# Patient Record
Sex: Female | Born: 1960
Health system: Southern US, Community
[De-identification: ages and names within clinical notes are randomized; demographics above are authoritative.]

## PROBLEM LIST (undated history)

## (undated) DIAGNOSIS — F419 Anxiety disorder, unspecified: Secondary | ICD-10-CM

## (undated) DIAGNOSIS — K219 Gastro-esophageal reflux disease without esophagitis: Secondary | ICD-10-CM

## (undated) DIAGNOSIS — F329 Major depressive disorder, single episode, unspecified: Secondary | ICD-10-CM

## (undated) DIAGNOSIS — G43909 Migraine, unspecified, not intractable, without status migrainosus: Secondary | ICD-10-CM

## (undated) DIAGNOSIS — F32A Depression, unspecified: Secondary | ICD-10-CM

## (undated) HISTORY — DX: Migraine, unspecified, not intractable, without status migrainosus: G43.909

## (undated) HISTORY — PX: BACK SURGERY: SHX140

## (undated) HISTORY — PX: KNEE ARTHROSCOPY: SHX127

## (undated) HISTORY — DX: Depression, unspecified: F32.A

## (undated) HISTORY — DX: Anxiety disorder, unspecified: F41.9

## (undated) HISTORY — DX: Major depressive disorder, single episode, unspecified: F32.9

## (undated) HISTORY — DX: Gastro-esophageal reflux disease without esophagitis: K21.9

---

## 2000-03-09 ENCOUNTER — Ambulatory Visit (HOSPITAL_COMMUNITY): Admission: RE | Admit: 2000-03-09 | Discharge: 2000-03-09 | Payer: Self-pay | Admitting: Psychiatry

## 2000-06-09 ENCOUNTER — Encounter: Payer: Self-pay | Admitting: Neurology

## 2000-06-09 ENCOUNTER — Ambulatory Visit (HOSPITAL_COMMUNITY): Admission: RE | Admit: 2000-06-09 | Discharge: 2000-06-09 | Payer: Self-pay | Admitting: Neurology

## 2005-09-29 ENCOUNTER — Ambulatory Visit: Payer: Self-pay | Admitting: Obstetrics and Gynecology

## 2005-10-26 HISTORY — PX: ABDOMINAL HYSTERECTOMY: SHX81

## 2006-06-21 ENCOUNTER — Ambulatory Visit: Payer: Self-pay | Admitting: Obstetrics and Gynecology

## 2007-11-17 ENCOUNTER — Ambulatory Visit: Payer: Self-pay

## 2007-12-14 ENCOUNTER — Ambulatory Visit: Payer: Self-pay | Admitting: Chiropractic Medicine

## 2008-01-20 ENCOUNTER — Encounter (INDEPENDENT_AMBULATORY_CARE_PROVIDER_SITE_OTHER): Payer: Self-pay | Admitting: Orthopedic Surgery

## 2008-01-20 ENCOUNTER — Ambulatory Visit (HOSPITAL_COMMUNITY): Admission: RE | Admit: 2008-01-20 | Discharge: 2008-01-21 | Payer: Self-pay | Admitting: Orthopedic Surgery

## 2008-03-06 ENCOUNTER — Encounter: Payer: Self-pay | Admitting: Orthopedic Surgery

## 2008-03-26 ENCOUNTER — Encounter: Payer: Self-pay | Admitting: Orthopedic Surgery

## 2008-04-25 ENCOUNTER — Encounter: Payer: Self-pay | Admitting: Orthopedic Surgery

## 2008-11-20 ENCOUNTER — Ambulatory Visit: Payer: Self-pay

## 2009-04-01 ENCOUNTER — Ambulatory Visit: Payer: Self-pay | Admitting: Internal Medicine

## 2009-11-21 ENCOUNTER — Ambulatory Visit: Payer: Self-pay

## 2010-06-04 ENCOUNTER — Ambulatory Visit: Payer: Self-pay | Admitting: Internal Medicine

## 2010-12-01 ENCOUNTER — Ambulatory Visit: Payer: Self-pay

## 2011-03-10 NOTE — Op Note (Signed)
Alexandra Mendez, Alexandra Mendez           ACCOUNT NO.:  [id number]   MEDICAL RECORD NO.:  [id number]          PATIENT TYPE:  AMB   LOCATION:  DAY                          FACILITY:  [facility]   PHYSICIAN:  Alvy Beal, MD    DATE OF BIRTH:  May 25, 1961   DATE OF PROCEDURE:  01/20/2008  DATE OF DISCHARGE:                               OPERATIVE REPORT   PREOPERATIVE DIAGNOSIS:  Severe left leg pain with L4-5 synovial cyst  left side.   POSTOPERATIVE DIAGNOSIS:  Severe left leg pain with L4-5 synovial cyst  left side.   OPERATIVE PROCEDURE:  L4-5 left-sided decompression and cyst excision  (foraminotomy).   SURGEON:  Alvy Beal, MD.   FIRST ASSISTANT:  Crissie Reese, PA   INDICATED HISTORY:  This is a very pleasant 50 year old woman who  presented to my office acutely with severe left leg pain and severe back  pain.  MRI and clinical exam confirmed the diagnosis of radiculopathy L5  secondary to a left-sided synovial cyst.  After discussing treatment  options the patient elected to proceed with surgery because of the  severity of the radicular leg pain.  All appropriate risks, benefits and  alternatives were discussed with the patient and consent was obtained.   PROCEDURE IN DETAIL:  The patient was brought into the operating room,  placed supine on the operating table.  After successful induction of  general anesthesia endotracheal intubation, TEDs/SCDs were applied.  She  was turned prone onto a Wilson frame and all bony prominences were well-  padded.  The arms were placed overhead.  The back was prepped and  draped.  Two needles were placed into the lumbar spine.  Intraoperative  x-rays were taken.  At this point because of a transitional vertebra I  noted that the needle placement was at the appropriate level.  I then  sharply made an incision and sharply dissected the paraspinal muscles  off the L4-L5 spinous processes to expose the interbody space.  I then  placed a  Penfield 4 underneath the L4 lamina and took an x-ray.  I  confirmed that I was at the appropriate level and then using a 3 mm  Kerrison performed a laminotomy.  I then excised the ligamentum flavum  by developing a plane between it and the dura and identified a very  large facet left-sided cyst.  I then resected this cyst in its entirety  and sent it to pathology.  I then dissected down the lateral recess and  palpated along the L4-5 facet to ensure that I had debrided the entire  cyst.  Once I had removed the entire cyst I obtained hemostasis using  bipolar electrocautery.  At this point I could freely pass a hockey  stick (dural elevator) superiorly along the lateral recess, inferiorly  and out the neural foramen.  I could even sweep medially.  I could  visualize the medial border of the pedicle and therefore I knew I had an  adequate lateral recess decompression and I excised the entire cyst.  At  this point clinically I could not see any more of the  cyst either.  I  irrigated the wound copiously with normal saline, used bipolar  electrocautery to obtain hemostasis and then maintained it with FloSeal.  I then closed the deep  fascia with interrupted #1 Vicryl sutures, superficial with 2-0 Vicryl  sutures and then 3-0 Monocryl for the skin.  Steri-Strips, dry dressing  were applied and the patient was extubated and transferred to the PACU  without incident.  At the end of the case all needle and sponge counts  were correct.      Alvy Beal, MD  Electronically Signed     DDB/MEDQ  D:  01/20/2008  T:  01/21/2008  Job:  [id number]

## 2011-03-13 NOTE — Procedures (Signed)
[place]  Patient:    KISHA, MESSMAN                    MRN: [id number] Proc. Date: 03/09/00 Adm. Date:  [id number] Attending:  Auburn Bilberry Courson                           Procedure Report  DATE OF BIRTH:  1961-06-26  INDICATIONS:  Patient with symptoms of dizziness and numbness, and white matter changes noted on MRI.  A lumber puncture to further evaluated for possible multiple sclerosis.  DESCRIPTION OF PROCEDURE:  The patient was prepped and draped in a sterile fashion.  Anesthesia was achieved with 1% lidocaine in the L4-5 interspace. Following this, a #20 gauge spinal needle was inserted into the same interspace until spinal fluid was obtained.  Main pressure was 130 cc water. Approximately 10 cc of clear spinal fluid was removed and sent for studies. Tube #1 was protein and glucose.  Tube #2 was cell count differential. Tube #3 ______ and IgG index.  Tube #4 was to held in the lab.  The patient tolerated the procedure without difficulty. DD:  03/09/00 TD:  03/10/00 Job: [id number]

## 2011-07-20 LAB — HEMOGLOBIN AND HEMATOCRIT, BLOOD
HCT: 38.4
Hemoglobin: 13

## 2011-08-20 ENCOUNTER — Ambulatory Visit: Payer: Self-pay | Admitting: Obstetrics and Gynecology

## 2011-12-31 ENCOUNTER — Ambulatory Visit: Payer: Self-pay

## 2013-01-02 ENCOUNTER — Ambulatory Visit: Payer: Self-pay

## 2013-01-18 ENCOUNTER — Emergency Department: Payer: Self-pay | Admitting: Emergency Medicine

## 2013-01-18 LAB — CK TOTAL AND CKMB (NOT AT ARMC)
CK, Total: 69 U/L (ref 21–215)
CK-MB: 1.3 ng/mL (ref 0.5–3.6)

## 2013-01-18 LAB — COMPREHENSIVE METABOLIC PANEL
Albumin: 4 g/dL (ref 3.4–5.0)
Alkaline Phosphatase: 77 U/L (ref 50–136)
Anion Gap: 5 — ABNORMAL LOW (ref 7–16)
BUN: 17 mg/dL (ref 7–18)
Bilirubin,Total: 0.4 mg/dL (ref 0.2–1.0)
Calcium, Total: 9 mg/dL (ref 8.5–10.1)
Chloride: 104 mmol/L (ref 98–107)
Co2: 29 mmol/L (ref 21–32)
Creatinine: 0.91 mg/dL (ref 0.60–1.30)
EGFR (African American): >60
EGFR (Non-African Amer.): >60
Glucose: 92 mg/dL (ref 65–99)
Osmolality: 277 (ref 275–301)
Potassium: 3.7 mmol/L (ref 3.5–5.1)
SGOT(AST): 18 U/L (ref 15–37)
SGPT (ALT): 22 U/L (ref 12–78)
Sodium: 138 mmol/L (ref 136–145)
Total Protein: 7.2 g/dL (ref 6.4–8.2)

## 2013-01-18 LAB — TROPONIN I: Troponin-I: <0.02 ng/mL

## 2013-01-18 LAB — CBC
HCT: 39.5 % (ref 35.0–47.0)
HGB: 13.5 g/dL (ref 12.0–16.0)
MCH: 29.2 pg (ref 26.0–34.0)
MCHC: 34.2 g/dL (ref 32.0–36.0)
MCV: 85 fL (ref 80–100)
Platelet: 210 10*3/uL (ref 150–440)
RBC: 4.63 10*6/uL (ref 3.80–5.20)
RDW: 13.7 % (ref 11.5–14.5)
WBC: 5 10*3/uL (ref 3.6–11.0)

## 2014-01-03 ENCOUNTER — Ambulatory Visit: Payer: Self-pay

## 2014-04-02 DIAGNOSIS — F32A Depression, unspecified: Secondary | ICD-10-CM | POA: Insufficient documentation

## 2014-04-02 DIAGNOSIS — K219 Gastro-esophageal reflux disease without esophagitis: Secondary | ICD-10-CM | POA: Insufficient documentation

## 2014-04-02 DIAGNOSIS — F329 Major depressive disorder, single episode, unspecified: Secondary | ICD-10-CM | POA: Insufficient documentation

## 2014-04-02 DIAGNOSIS — G43909 Migraine, unspecified, not intractable, without status migrainosus: Secondary | ICD-10-CM | POA: Insufficient documentation

## 2014-04-02 DIAGNOSIS — R030 Elevated blood-pressure reading, without diagnosis of hypertension: Secondary | ICD-10-CM | POA: Insufficient documentation

## 2015-01-08 ENCOUNTER — Ambulatory Visit: Payer: Self-pay

## 2015-05-10 ENCOUNTER — Encounter: Payer: Self-pay | Admitting: Obstetrics and Gynecology

## 2015-05-15 ENCOUNTER — Encounter: Payer: Self-pay | Admitting: Obstetrics and Gynecology

## 2015-05-15 ENCOUNTER — Ambulatory Visit (INDEPENDENT_AMBULATORY_CARE_PROVIDER_SITE_OTHER): Payer: No Typology Code available for payment source | Admitting: Obstetrics and Gynecology

## 2015-05-15 VITALS — BP 133/90 | HR 81 | Ht 62.0 in | Wt 158.6 lb

## 2015-05-15 DIAGNOSIS — E663 Overweight: Secondary | ICD-10-CM | POA: Diagnosis not present

## 2015-05-15 DIAGNOSIS — F411 Generalized anxiety disorder: Secondary | ICD-10-CM | POA: Diagnosis not present

## 2015-05-15 DIAGNOSIS — N951 Menopausal and female climacteric states: Secondary | ICD-10-CM | POA: Diagnosis not present

## 2015-05-15 DIAGNOSIS — N941 Dyspareunia: Secondary | ICD-10-CM | POA: Diagnosis not present

## 2015-05-15 DIAGNOSIS — IMO0002 Reserved for concepts with insufficient information to code with codable children: Secondary | ICD-10-CM

## 2015-05-15 MED ORDER — PHENTERMINE HCL 37.5 MG PO TABS: 37.5000 mg | ORAL_TABLET | Freq: Every day | ORAL | Status: DC

## 2015-05-15 MED ORDER — CYANOCOBALAMIN 1000 MCG/ML IJ SOLN
1000.0000 ug | Freq: Once | INTRAMUSCULAR | Status: AC
  Administered 2015-05-15: 1000 ug via INTRAMUSCULAR

## 2015-05-15 MED ORDER — LORAZEPAM 0.5 MG PO TABS: 0.5000 mg | ORAL_TABLET | Freq: Three times a day (TID) | ORAL | Status: DC

## 2015-05-15 MED ORDER — CYANOCOBALAMIN 1000 MCG/ML IJ SOLN: 1000.0000 ug | Freq: Once | INTRAMUSCULAR | Status: DC

## 2015-05-15 NOTE — Patient Instructions (Signed)
Thank you for enrolling in MyChart. Please follow the instructions below to securely access your online medical record. MyChart allows you to send messages to your doctor, view your test results, renew your prescriptions, schedule appointments, and more.  How Do I Sign Up? 1. In your Internet browser, go to [email or web address]. 2. [email or web address] on the New  User? link in the Sign In box.  3. Enter your MyChart Access Code exactly as it appears below. You will not need to use this code after you have completed the sign-up process. If you do not sign up before the expiration date, you must request a new code. MyChart Access Code: [email or web address] Expires: 07/14/2015  3:44 PM  4. Enter the last four digits of your Social Security Number (xxxx) and Date of Birth (mm/dd/yyyy) as indicated and click Next. You will be taken to the next sign-up page. 5. Create a MyChart ID. This will be your MyChart login ID and cannot be changed, so think of one that is secure and easy to remember. 6. Create a MyChart password. You can change your password at any time. 7. Enter your Password Reset Question and Answer and click Next. This can be used at a later time if you forget your password.  8. Select your communication preference, and if applicable enter your e-mail address. You will receive e-mail notification when new information is available in MyChart by choosing to receive e-mail notifications and filling in your e-mail. 9. Click Sign In. You can now view your medical record.   Additional Information If you have questions, you can email [email or web address] or call [phone] to talk to our MyChart staff. Remember, MyChart is NOT to be used for urgent needs. For medical emergencies, dial 911.   Calorie Counting for Weight Loss Calories are energy you get from the things you eat and drink. Your body uses this energy to keep you going throughout the day. The number of calories you eat  affects your weight. When you eat more calories than your body needs, your body stores the extra calories as fat. When you eat fewer calories than your body needs, your body burns fat to get the energy it needs. Calorie counting means keeping track of how many calories you eat and drink each day. If you make sure to eat fewer calories than your body needs, you should lose weight. In order for calorie counting to work, you will need to eat the number of calories that are right for you in a day to lose a healthy amount of weight per week. A healthy amount of weight to lose per week is usually 1-2 lb (0.5-0.9 kg). A dietitian can determine how many calories you need in a day and give you suggestions on how to reach your calorie goal.  WHAT IS MY MY PLAN? My goal is to have __________ calories per day.  If I have this many calories per day, I should lose around __________ pounds per week. WHAT DO I NEED TO KNOW ABOUT CALORIE COUNTING? In order to meet your daily calorie goal, you will need to:  Find out how many calories are in each food you would like to eat. Try to do this before you eat.  Decide how much of the food you can eat.  Write down what you ate and how many calories it had. Doing this is called keeping a food log. WHERE DO I FIND CALORIE INFORMATION? The number of calories in a food can  be found on a Nutrition Facts label. Note that all the information on a label is based on a specific serving of the food. If a food does not have a Nutrition Facts label, try to look up the calories online or ask your dietitian for help. HOW DO I DECIDE HOW MUCH TO EAT? To decide how much of the food you can eat, you will need to consider both the number of calories in one serving and the size of one serving. This information can be found on the Nutrition Facts label. If a food does not have a Nutrition Facts label, look up the information online or ask your dietitian for help. Remember that calories are  listed per serving. If you choose to have more than one serving of a food, you will have to multiply the calories per serving by the amount of servings you plan to eat. For example, the label on a package of bread might say that a serving size is 1 slice and that there are 90 calories in a serving. If you eat 1 slice, you will have eaten 90 calories. If you eat 2 slices, you will have eaten 180 calories. HOW DO I KEEP A FOOD LOG? After each meal, record the following information in your food log:  What you ate.  How much of it you ate.  How many calories it had.  Then, add up your calories. Keep your food log near you, such as in a small notebook in your pocket. Another option is to use a mobile app or website. Some programs will calculate calories for you and show you how many calories you have left each time you add an item to the log. WHAT ARE SOME CALORIE COUNTING TIPS?  Use your calories on foods and drinks that will fill you up and not leave you hungry. Some examples of this include foods like nuts and nut butters, vegetables, lean proteins, and high-fiber foods (more than 5 g fiber per serving).  Eat nutritious foods and avoid empty calories. Empty calories are calories you get from foods or beverages that do not have many nutrients, such as candy and soda. It is better to have a nutritious high-calorie food (such as an avocado) than a food with few nutrients (such as a bag of chips).  Know how many calories are in the foods you eat most often. This way, you do not have to look up how many calories they have each time you eat them.  Look out for foods that may seem like low-calorie foods but are really high-calorie foods, such as baked goods, soda, and fat-free candy.  Pay attention to calories in drinks. Drinks such as sodas, specialty coffee drinks, alcohol, and juices have a lot of calories yet do not fill you up. Choose low-calorie drinks like water and diet drinks.  Focus your  calorie counting efforts on higher calorie items. Logging the calories in a garden salad that contains only vegetables is less important than calculating the calories in a milk shake.  Find a way of tracking calories that works for you. Get creative. Most people who are successful find ways to keep track of how much they eat in a day, even if they do not count every calorie. WHAT ARE SOME PORTION CONTROL TIPS?  Know how many calories are in a serving. This will help you know how many servings of a certain food you can have.  Use a measuring cup to measure serving sizes. This is  helpful when you start out. With time, you will be able to estimate serving sizes for some foods.  Take some time to put servings of different foods on your favorite plates, bowls, and cups so you know what a serving looks like.  Try not to eat straight from a bag or box. Doing this can lead to overeating. Put the amount you would like to eat in a cup or on a plate to make sure you are eating the right portion.  Use smaller plates, glasses, and bowls to prevent overeating. This is a quick and easy way to practice portion control. If your plate is smaller, less food can fit on it.  Try not to multitask while eating, such as watching TV or using your computer. If it is time to eat, sit down at a table and enjoy your food. Doing this will help you to start recognizing when you are full. It will also make you more aware of what and how much you are eating. HOW CAN I CALORIE COUNT WHEN EATING OUT?  Ask for smaller portion sizes or child-sized portions.  Consider sharing an entree and sides instead of getting your own entree.  If you get your own entree, eat only half. Ask for a box at the beginning of your meal and put the rest of your entree in it so you are not tempted to eat it.  Look for the calories on the menu. If calories are listed, choose the lower calorie options.  Choose dishes that include vegetables, fruits,  whole grains, low-fat dairy products, and lean protein. Focusing on smart food choices from each of the 5 food groups can help you stay on track at restaurants.  Choose items that are boiled, broiled, grilled, or steamed.  Choose water, milk, unsweetened iced tea, or other drinks without added sugars. If you want an alcoholic beverage, choose a lower calorie option. For example, a regular margarita can have up to 700 calories and a glass of wine has around 150.  Stay away from items that are buttered, battered, fried, or served with cream sauce. Items labeled "crispy" are usually fried, unless stated otherwise.  Ask for dressings, sauces, and syrups on the side. These are usually very high in calories, so do not eat much of them.  Watch out for salads. Many people think salads are a healthy option, but this is often not the case. Many salads come with bacon, fried chicken, lots of cheese, fried chips, and dressing. All of these items have a lot of calories. If you want a salad, choose a garden salad and ask for grilled meats or steak. Ask for the dressing on the side, or ask for olive oil and vinegar or lemon to use as dressing.  Estimate how many servings of a food you are given. For example, a serving of cooked rice is  cup or about the size of half a tennis ball or one cupcake wrapper. Knowing serving sizes will help you be aware of how much food you are eating at restaurants. The list below tells you how big or small some common portion sizes are based on everyday objects.  1 oz--4 stacked dice.  3 oz--1 deck of cards.  1 tsp--1 dice.  1 Tbsp-- a Ping-Pong ball.  2 Tbsp--1 Ping-Pong ball.   cup--1 tennis ball or 1 cupcake wrapper.  1 cup--1 baseball. Document Released: 10/12/2005 Document Revised: 02/26/2014 Document Reviewed: 08/17/2013 [facility] Patient Information 2015 [place], [place]. This information is not  intended to replace advice given to you by your health care provider.  Make sure you discuss any questions you have with your health care provider.

## 2015-05-15 NOTE — Progress Notes (Signed)
Subjective:     Patient ID: Alexandra Mendez, female   DOB: 1961-02-14, 54 y.o.   MRN: [id number]  HPI Previously saw Flo Shanks CNM at [facility] before she retired- last AE in January and labs were normal.    Here to establish care and discuss a couple of health issues/concerns Review of Systems Increased hot flashes x 1 year (s/p partial hysterectomy 2007 for fibroids), with occasional night sweats, also has noticed an increase in anxiety and hot flashes during panic episodes. Reports progressively worsening pain during intercourse with 2 episodes of bleeding afterwards; partner not new and denies feeling anything any different- has noticed some dryness. Reports pain deep inside vagina with increased pressure sensation ( like there is a mass there), continues for a while after finishes intercourse.  Reports increased stress at work recently with most anxiety r/t that. Also concerned with slow but progressive weight gain aver that last few years- has been exercising 2-3 x/week with improved diet and weight continues to increase.    Objective:   Physical Exam A&O x4 Well groomed overweight female in no apparent distress Thyroid normal to palpation HRR Abdomen soft and nontender Pelvic exam: normal external genitalia, vulva, vagina, cervix, uterus and adnexa, VULVA: normal appearing vulva with no masses, tenderness or lesions, VAGINA: atrophic, vaginal tenderness anteriorly with deep palpation- cervical cuff without defect or lesions, vaginal discharge - scant and thin, ADNEXA: normal adnexa in size, nontender and no masses.    Assessment:     1:Painful intercourse 2:Vasomotor hotflashes 3:Anxiety 4:overweight     Plan:     1:   Discussed vaginal changes postmenopausal and need for position changes and increase lubricant use 2: labs obtained today- will treat accordingly 3: to continue wellbutrin, and added xanax 0.76m as rescue medication for situational panic attacks. 4: to start  weight loss plan here- rx for adipex, B12 given; committed to increase exercise to 4x/wk- RTC 4 weeks for wt/bp check and next [place] CNM

## 2015-05-16 LAB — FOLLICLE STIMULATING HORMONE: FSH: 98.1 m[IU]/mL

## 2015-05-16 LAB — THYROID PANEL WITH TSH
Free Thyroxine Index: 2 (ref 1.2–4.9)
T3 Uptake Ratio: 28 % (ref 24–39)
T4 TOTAL: 7 ug/dL (ref 4.5–12.0)
TSH: 2.86 u[IU]/mL (ref 0.450–4.500)

## 2015-05-16 LAB — PROGESTERONE: PROGESTERONE: 0.2 ng/mL

## 2015-05-16 LAB — ESTRADIOL: Estradiol: <5 pg/mL

## 2015-05-16 LAB — COMPREHENSIVE METABOLIC PANEL
A/G RATIO: 2.8 — AB (ref 1.1–2.5)
ALK PHOS: 89 IU/L (ref 39–117)
ALT: 14 IU/L (ref 0–32)
AST: 18 IU/L (ref 0–40)
Albumin: 4.7 g/dL (ref 3.5–5.5)
BILIRUBIN TOTAL: 0.3 mg/dL (ref 0.0–1.2)
BUN/Creatinine Ratio: 17 (ref 9–23)
BUN: 17 mg/dL (ref 6–24)
CALCIUM: 9.5 mg/dL (ref 8.7–10.2)
CHLORIDE: 99 mmol/L (ref 97–108)
CO2: 24 mmol/L (ref 18–29)
CREATININE: 1 mg/dL (ref 0.57–1.00)
GFR calc Af Amer: 75 mL/min/{1.73_m2} (ref >59)
GFR, EST NON AFRICAN AMERICAN: 65 mL/min/{1.73_m2} (ref >59)
Globulin, Total: 1.7 g/dL (ref 1.5–4.5)
Glucose: 100 mg/dL — ABNORMAL HIGH (ref 65–99)
Potassium: 4.3 mmol/L (ref 3.5–5.2)
Sodium: 141 mmol/L (ref 134–144)
Total Protein: 6.4 g/dL (ref 6.0–8.5)

## 2015-05-16 LAB — DHEA-SULFATE: DHEA-SO4: 112.1 ug/dL (ref 41.2–243.7)

## 2015-05-16 LAB — HEMOGLOBIN A1C
Est. average glucose Bld gHb Est-mCnc: 123 mg/dL
Hgb A1c MFr Bld: 5.9 % — ABNORMAL HIGH (ref 4.8–5.6)

## 2015-05-21 ENCOUNTER — Other Ambulatory Visit: Payer: Self-pay | Admitting: Obstetrics and Gynecology

## 2015-05-21 ENCOUNTER — Telehealth: Payer: Self-pay | Admitting: *Deleted

## 2015-05-21 NOTE — Telephone Encounter (Signed)
Notified pt of results, she would be very appreciative if you would send in medication for HRT

## 2015-05-21 NOTE — Telephone Encounter (Signed)
-----   Message from Ulyses Amor, [place] sent at 05/21/2015  2:52 PM EDT ----- Please let her know the following labs: Hormone levels confirm she is fully in menopause and could benefit from some HRT- we talked at her appointment about adding some low dose estrogen and progesterone to help relieve hot flashes and improve sleep/anxiety/weight loss. If she is interested in doing that I will send in a prescription.   Her diabetes screen showed her blood glucose is high too much and classifies her as pre-diabetic.  I expect that should improve with the weight loss and exercise. I want to recheck her levels in 3 months and make sure they are coming down.  All other labs were normal.

## 2015-05-22 ENCOUNTER — Other Ambulatory Visit: Payer: Self-pay | Admitting: Obstetrics and Gynecology

## 2015-05-22 MED ORDER — ESTRADIOL 0.5 MG PO TABS: 0.5000 mg | ORAL_TABLET | Freq: Every day | ORAL | Status: DC

## 2015-05-22 MED ORDER — PROGESTERONE MICRONIZED 200 MG PO CAPS: 200.0000 mg | ORAL_CAPSULE | Freq: Every day | ORAL | Status: DC

## 2015-06-13 ENCOUNTER — Ambulatory Visit (INDEPENDENT_AMBULATORY_CARE_PROVIDER_SITE_OTHER): Payer: No Typology Code available for payment source | Admitting: Obstetrics and Gynecology

## 2015-06-13 VITALS — BP 130/89 | HR 75 | Ht 62.0 in | Wt 150.2 lb

## 2015-06-13 DIAGNOSIS — E663 Overweight: Secondary | ICD-10-CM

## 2015-06-13 MED ORDER — CYANOCOBALAMIN 1000 MCG/ML IJ SOLN
1000.0000 ug | Freq: Once | INTRAMUSCULAR | Status: AC
  Administered 2015-06-13: 1000 ug via INTRAMUSCULAR

## 2015-06-13 NOTE — Progress Notes (Cosign Needed)
Patient ID: Alexandra Mendez, female   DOB: 1961/04/23, 54 y.o.   MRN: [id number]  Pt here for weight, B/P, check and B-12 injection. Pt has lost 8 lbs since last visit. No c/o side effects of phentermine. No changes in medical history or medications.

## 2015-07-18 ENCOUNTER — Ambulatory Visit (INDEPENDENT_AMBULATORY_CARE_PROVIDER_SITE_OTHER): Payer: No Typology Code available for payment source | Admitting: Obstetrics and Gynecology

## 2015-07-18 VITALS — BP 124/85 | HR 85 | Ht 62.0 in | Wt 145.6 lb

## 2015-07-18 DIAGNOSIS — E663 Overweight: Secondary | ICD-10-CM

## 2015-07-18 MED ORDER — CYANOCOBALAMIN 1000 MCG/ML IJ SOLN
1000.0000 ug | Freq: Once | INTRAMUSCULAR | Status: AC
  Administered 2015-07-18: 1000 ug via INTRAMUSCULAR

## 2015-07-18 NOTE — Progress Notes (Cosign Needed)
Patient ID: Alexandra Mendez, female   DOB: 03/04/1961, 54 y.o.   MRN: [id number] Pt presents for weight, B/P, B-12 injection. No side effects of medication-Phentermine, or B-12.  Weight loss/gain of  5  lbs. Encouraged eating healthy and exercise.

## 2015-08-15 ENCOUNTER — Ambulatory Visit (INDEPENDENT_AMBULATORY_CARE_PROVIDER_SITE_OTHER): Payer: No Typology Code available for payment source | Admitting: Obstetrics and Gynecology

## 2015-08-15 VITALS — BP 128/95 | HR 76 | Ht 62.0 in | Wt 144.4 lb

## 2015-08-15 DIAGNOSIS — E669 Obesity, unspecified: Secondary | ICD-10-CM

## 2015-08-15 MED ORDER — CYANOCOBALAMIN 1000 MCG/ML IJ SOLN
1000.0000 ug | Freq: Once | INTRAMUSCULAR | Status: AC
  Administered 2015-08-15: 1000 ug via INTRAMUSCULAR

## 2015-08-15 NOTE — Progress Notes (Cosign Needed)
Patient ID: Alexandra Mendez, female   DOB: 03/20/1961, 54 y.o.   MRN: [id number] Pt presents for weight, B/P, B-12 injection. No side effects of medication-Phentermine, or B-12.  Weight loss of 1 lbs. Encouraged eating healthy and exercise.

## 2015-09-04 ENCOUNTER — Telehealth: Payer: Self-pay | Admitting: Obstetrics and Gynecology

## 2015-09-04 ENCOUNTER — Other Ambulatory Visit: Payer: Self-pay | Admitting: Obstetrics and Gynecology

## 2015-09-04 MED ORDER — BUPROPION HCL ER (XL) 300 MG PO TB24: 300.0000 mg | ORAL_TABLET | Freq: Every day | ORAL | Status: DC

## 2015-09-04 NOTE — Telephone Encounter (Signed)
Pt is currently coming for weight mngt. And she is est here care here since [place]. Pt is is on Wellbutrin 1 daily and her refills are out. She has an appt here in dec to see you , but wants to know if you can refill her medication. She has been w/o it for 3 days now.

## 2015-09-04 NOTE — Telephone Encounter (Signed)
Please let her know refill was sent in

## 2015-09-04 NOTE — Telephone Encounter (Signed)
[name]

## 2015-09-05 NOTE — Telephone Encounter (Signed)
Notified pt rx has been sent in

## 2015-09-26 ENCOUNTER — Encounter: Payer: Self-pay | Admitting: Obstetrics and Gynecology

## 2015-09-26 ENCOUNTER — Ambulatory Visit (INDEPENDENT_AMBULATORY_CARE_PROVIDER_SITE_OTHER): Payer: Managed Care, Other (non HMO) | Admitting: Obstetrics and Gynecology

## 2015-09-26 VITALS — BP 130/89 | HR 77 | Ht 61.0 in | Wt 142.8 lb

## 2015-09-26 DIAGNOSIS — E663 Overweight: Secondary | ICD-10-CM

## 2015-09-26 MED ORDER — PHENTERMINE HCL 37.5 MG PO TABS: 37.5000 mg | ORAL_TABLET | Freq: Every day | ORAL | Status: DC

## 2015-09-26 MED ORDER — CYANOCOBALAMIN 1000 MCG/ML IJ SOLN: 1000.0000 ug | Freq: Once | INTRAMUSCULAR | Status: DC

## 2015-09-26 NOTE — Progress Notes (Signed)
SUBJECTIVE:  [age] y.o. here for follow-up weight loss visit, previously seen 4 weeks ago. Denies any concerns and feels like medication worked well with a 16# with loss. Has been off medicine x 3 weeks, desires restart.  OBJECTIVE:  BP 130/89 mmHg  Pulse 77  Ht 5\' 1"  (1.549 m)  Wt 142 lb 12.8 oz (64.774 kg)  BMI 27.00 kg/m2  Body mass index is 27 kg/(m^2). Patient appears well. ASSESSMENT:  Obesity- responding well to weight loss plan PLAN:  To continue with current medications. B12 106400mcg/ml injection given RTC in 4 weeks as planned  Melody Ashton-Sandy Spring[place], CNM

## 2015-10-24 ENCOUNTER — Ambulatory Visit: Payer: Managed Care, Other (non HMO)

## 2015-10-31 ENCOUNTER — Ambulatory Visit (INDEPENDENT_AMBULATORY_CARE_PROVIDER_SITE_OTHER): Payer: Managed Care, Other (non HMO) | Admitting: Obstetrics and Gynecology

## 2015-10-31 VITALS — BP 142/96 | Wt 141.4 lb

## 2015-10-31 DIAGNOSIS — E669 Obesity, unspecified: Secondary | ICD-10-CM

## 2015-10-31 MED ORDER — CYANOCOBALAMIN 1000 MCG/ML IJ SOLN
1000.0000 ug | Freq: Once | INTRAMUSCULAR | Status: AC
  Administered 2015-10-31: 1000 ug via INTRAMUSCULAR

## 2015-10-31 NOTE — Progress Notes (Cosign Needed)
Patient ID: Alexandra Mendez, female   DOB: 01/09/1961, 55 y.o.   MRN: [id number] Pt presents for weight, B/P, B-12 injection. No side effects of medication-Phentermine, or B-12.  Weight loss of 1 lbs. Encouraged eating healthy and exercise. Pt happy with this, especially since it was the holidays. No s/s elevated B/P. Pt to keep a check on this at home and to contact office if this persist.

## 2015-11-19 ENCOUNTER — Telehealth: Payer: Self-pay | Admitting: Obstetrics and Gynecology

## 2015-11-19 NOTE — Telephone Encounter (Signed)
Hey Anette said her pharmacy faxed a refill request on erstadiol and progesterone and they haven't received it back

## 2015-11-19 NOTE — Telephone Encounter (Signed)
Was faxed this am

## 2015-11-26 ENCOUNTER — Ambulatory Visit: Payer: Managed Care, Other (non HMO)

## 2015-11-26 ENCOUNTER — Ambulatory Visit (INDEPENDENT_AMBULATORY_CARE_PROVIDER_SITE_OTHER): Payer: Managed Care, Other (non HMO) | Admitting: Obstetrics and Gynecology

## 2015-11-26 VITALS — BP 127/92 | HR 78 | Wt 140.5 lb

## 2015-11-26 DIAGNOSIS — E669 Obesity, unspecified: Secondary | ICD-10-CM

## 2015-11-26 MED ORDER — CYANOCOBALAMIN 1000 MCG/ML IJ SOLN
1000.0000 ug | Freq: Once | INTRAMUSCULAR | Status: AC
  Administered 2015-11-26: 1000 ug via INTRAMUSCULAR

## 2015-11-26 NOTE — Progress Notes (Signed)
Patient ID: Alexandra Mendez, female   DOB: 1961-06-10, 55 y.o.   MRN: [id number] Pt presents for weight, B/P, B-12 injection. No side effects of medication-Phentermine, or B-12.  Weight loss of 1.8 lbs. Encouraged eating healthy and exercise. Started a new exercise program at a new gym, more intense.

## 2015-11-28 ENCOUNTER — Ambulatory Visit: Payer: Managed Care, Other (non HMO)

## 2015-12-02 ENCOUNTER — Telehealth: Payer: Self-pay | Admitting: Obstetrics and Gynecology

## 2015-12-02 NOTE — Telephone Encounter (Signed)
PT CALLED AND SHE IS ON [employer], AND SHE STATED THAT GOT AN EMAIL FROM HER PHARMACY AND THEY STATED TAHT THEY WERE WAITING ON A RESPONSE FROM [place], DID WE RECIEVE ANYTHING FROM HER PHARMACY, SHE USES [facility]. PT WOULD LIKE A CALL BACK WHEN DONE

## 2015-12-03 ENCOUNTER — Other Ambulatory Visit: Payer: Self-pay | Admitting: *Deleted

## 2015-12-03 MED ORDER — BUPROPION HCL ER (XL) 300 MG PO TB24: 300.0000 mg | ORAL_TABLET | Freq: Every day | ORAL | Status: DC

## 2015-12-03 NOTE — Telephone Encounter (Signed)
Medication refilled-ac

## 2015-12-24 ENCOUNTER — Ambulatory Visit (INDEPENDENT_AMBULATORY_CARE_PROVIDER_SITE_OTHER): Payer: Managed Care, Other (non HMO) | Admitting: Obstetrics and Gynecology

## 2015-12-24 VITALS — BP 114/86 | HR 81 | Wt 141.6 lb

## 2015-12-24 DIAGNOSIS — R8290 Unspecified abnormal findings in urine: Secondary | ICD-10-CM

## 2015-12-24 DIAGNOSIS — E663 Overweight: Secondary | ICD-10-CM

## 2015-12-24 LAB — POCT URINALYSIS DIPSTICK
Bilirubin, UA: NEGATIVE
Blood, UA: NEGATIVE
GLUCOSE UA: NEGATIVE
KETONES UA: NEGATIVE
Leukocytes, UA: NEGATIVE
Nitrite, UA: NEGATIVE
PROTEIN UA: NEGATIVE
Spec Grav, UA: 1.015
Urobilinogen, UA: NEGATIVE
pH, UA: 6

## 2015-12-24 MED ORDER — CYANOCOBALAMIN 1000 MCG/ML IJ SOLN
1000.0000 ug | Freq: Once | INTRAMUSCULAR | Status: AC
  Administered 2015-12-24: 1000 ug via INTRAMUSCULAR

## 2015-12-24 NOTE — Progress Notes (Signed)
Patient ID: Alexandra Mendez, female   DOB: 20-Oct-1961, 55 y.o.   MRN: [id number] Pt presents for weight, B/P, B-12 injection. No side effects of medication-Phentermine, or B-12.  Weight gain of 1 lbs. Encouraged eating healthy and exercise. Pt asked if her urine could be checked because her urine has a "bad" odor. No other s/s UTI. Pt states when she has had a UTI in the past she did not have any symptoms then. UA/UC done.

## 2015-12-26 ENCOUNTER — Other Ambulatory Visit: Payer: Self-pay | Admitting: Obstetrics and Gynecology

## 2015-12-26 ENCOUNTER — Telehealth: Payer: Self-pay | Admitting: *Deleted

## 2015-12-26 DIAGNOSIS — N390 Urinary tract infection, site not specified: Secondary | ICD-10-CM | POA: Insufficient documentation

## 2015-12-26 LAB — URINE CULTURE

## 2015-12-26 MED ORDER — CIPROFLOXACIN HCL 500 MG PO TABS: 500.0000 mg | ORAL_TABLET | Freq: Two times a day (BID) | ORAL | Status: DC

## 2015-12-26 NOTE — Telephone Encounter (Signed)
Notified pt of results 

## 2015-12-26 NOTE — Telephone Encounter (Signed)
-----   Message from Purcell Nails, [place] sent at 12/26/2015  9:38 AM EST ----- Please let her know she has a UTI- I sent in a prescription for antibiotic- needs to avoid sodas and increase water intake to help flush it out.

## 2016-01-21 ENCOUNTER — Ambulatory Visit (INDEPENDENT_AMBULATORY_CARE_PROVIDER_SITE_OTHER): Payer: Managed Care, Other (non HMO) | Admitting: Obstetrics and Gynecology

## 2016-01-21 ENCOUNTER — Encounter: Payer: Self-pay | Admitting: Obstetrics and Gynecology

## 2016-01-21 VITALS — BP 128/84 | HR 78 | Ht 62.0 in | Wt 140.1 lb

## 2016-01-21 DIAGNOSIS — R7303 Prediabetes: Secondary | ICD-10-CM

## 2016-01-21 DIAGNOSIS — E663 Overweight: Secondary | ICD-10-CM

## 2016-01-21 MED ORDER — CYANOCOBALAMIN 1000 MCG/ML IJ SOLN: 1000.0000 ug | Freq: Once | INTRAMUSCULAR | Status: DC

## 2016-01-21 MED ORDER — PHENTERMINE HCL 37.5 MG PO TABS: 37.5000 mg | ORAL_TABLET | Freq: Every day | ORAL | Status: DC

## 2016-01-21 NOTE — Progress Notes (Signed)
SUBJECTIVE:  55 y.o. here for follow-up weight loss visit, previously seen 4 weeks ago. Denies any concerns and feels like medication has worked well, just had difficulty staying focused on diet, water intake, and exercise (father has been sick). But has joined a gym and feels like she is getting back on track. Would like to continue meds to lose 10 more pounds. Also needs labs repeated.  OBJECTIVE:  BP 128/84 mmHg  Pulse 78  Ht 5\' 2"  (1.575 m)  Wt 140 lb 1.6 oz (63.549 kg)  BMI 25.62 kg/m2  Body mass index is 25.62 kg/(m^2). Patient appears well. ASSESSMENT:  Overweight- responding fair to weight loss plan Pre-diabetes PLAN:  To continue with current medications after taking one week off phentermine. RX given. B12 106600mcg/ml injection given. Labs redrawn RTC in 5 weeks as planned  Alexandra Mendez, [place]

## 2016-01-22 LAB — COMPREHENSIVE METABOLIC PANEL
ALBUMIN: 4.5 g/dL (ref 3.5–5.5)
ALK PHOS: 68 IU/L (ref 39–117)
ALT: 12 IU/L (ref 0–32)
AST: 14 IU/L (ref 0–40)
Albumin/Globulin Ratio: 2.6 — ABNORMAL HIGH (ref 1.2–2.2)
BILIRUBIN TOTAL: 0.4 mg/dL (ref 0.0–1.2)
BUN / CREAT RATIO: 19 (ref 9–23)
BUN: 16 mg/dL (ref 6–24)
CHLORIDE: 102 mmol/L (ref 96–106)
CO2: 22 mmol/L (ref 18–29)
Calcium: 9.7 mg/dL (ref 8.7–10.2)
Creatinine, Ser: 0.85 mg/dL (ref 0.57–1.00)
GFR calc Af Amer: 90 mL/min/{1.73_m2} (ref >59)
GFR calc non Af Amer: 78 mL/min/{1.73_m2} (ref >59)
GLUCOSE: 95 mg/dL (ref 65–99)
Globulin, Total: 1.7 g/dL (ref 1.5–4.5)
Potassium: 4.2 mmol/L (ref 3.5–5.2)
Sodium: 141 mmol/L (ref 134–144)
TOTAL PROTEIN: 6.2 g/dL (ref 6.0–8.5)

## 2016-01-22 LAB — HEMOGLOBIN A1C
Est. average glucose Bld gHb Est-mCnc: 120 mg/dL
Hgb A1c MFr Bld: 5.8 % — ABNORMAL HIGH (ref 4.8–5.6)

## 2016-01-23 ENCOUNTER — Telehealth: Payer: Self-pay | Admitting: *Deleted

## 2016-01-23 NOTE — Telephone Encounter (Signed)
Notified pt of lab results 

## 2016-01-23 NOTE — Telephone Encounter (Signed)
-----   Message from DilworthMelody N Shambley, [place] sent at 01/23/2016 12:59 PM EDT ----- Sugar a little better -not much- to add daily cinnamon 500mg  capsule as it helps regulate sugar. And will recheck at AE

## 2016-02-18 ENCOUNTER — Ambulatory Visit: Payer: Managed Care, Other (non HMO)

## 2016-02-25 ENCOUNTER — Ambulatory Visit: Payer: Managed Care, Other (non HMO)

## 2016-03-06 ENCOUNTER — Ambulatory Visit (INDEPENDENT_AMBULATORY_CARE_PROVIDER_SITE_OTHER): Payer: Managed Care, Other (non HMO) | Admitting: Obstetrics and Gynecology

## 2016-03-06 VITALS — BP 134/87 | HR 82 | Wt 139.4 lb

## 2016-03-06 DIAGNOSIS — E663 Overweight: Secondary | ICD-10-CM

## 2016-03-06 MED ORDER — CYANOCOBALAMIN 1000 MCG/ML IJ SOLN
1000.0000 ug | Freq: Once | INTRAMUSCULAR | Status: AC
  Administered 2016-03-06: 1000 ug via INTRAMUSCULAR

## 2016-03-06 NOTE — Progress Notes (Signed)
Patient ID: Alexandra Mendez, female   DOB: 10/27/1960, 55 y.o.   MRN: [id number] Pt presents for weight, B/P, B-12 injection. No side effects of medication-Phentermine, or B-12.  Weight loss of  1/2 lbs. Encouraged eating healthy and exercise. B-12 given in house, pt forgot hers.

## 2016-04-02 ENCOUNTER — Ambulatory Visit: Payer: Managed Care, Other (non HMO)

## 2016-04-03 ENCOUNTER — Ambulatory Visit (INDEPENDENT_AMBULATORY_CARE_PROVIDER_SITE_OTHER): Payer: Managed Care, Other (non HMO) | Admitting: Obstetrics and Gynecology

## 2016-04-03 VITALS — BP 123/75 | HR 79 | Ht 62.0 in | Wt 138.0 lb

## 2016-04-03 DIAGNOSIS — R5383 Other fatigue: Secondary | ICD-10-CM | POA: Diagnosis not present

## 2016-04-03 DIAGNOSIS — E669 Obesity, unspecified: Secondary | ICD-10-CM

## 2016-04-03 MED ORDER — CYANOCOBALAMIN 1000 MCG/ML IJ SOLN
1000.0000 ug | Freq: Once | INTRAMUSCULAR | Status: AC
  Administered 2016-04-03: 1000 ug via INTRAMUSCULAR

## 2016-04-03 NOTE — Patient Instructions (Signed)
Follow up in 4wks, Bring B12 injection

## 2016-04-03 NOTE — Progress Notes (Signed)
Filed Weights   04/03/16 1109  Weight: 138 lb (62.596 kg)   Pt presents for weight, B/P, B-12 injection. No side effects of medication-Phentermine, or B-12.  Weight loss of 1lb. Encouraged eating healthy and exercise. Pt forgot B12 injection given in house supply. Pt given 1mL injection of B12, tolerated well. To follow up in 4wks.

## 2016-04-30 ENCOUNTER — Ambulatory Visit: Payer: Managed Care, Other (non HMO)

## 2016-05-14 ENCOUNTER — Ambulatory Visit: Payer: Managed Care, Other (non HMO)

## 2016-06-26 ENCOUNTER — Other Ambulatory Visit: Payer: Self-pay | Admitting: Obstetrics and Gynecology

## 2016-07-31 ENCOUNTER — Ambulatory Visit: Payer: Managed Care, Other (non HMO) | Admitting: Podiatry

## 2016-10-29 ENCOUNTER — Other Ambulatory Visit: Payer: Self-pay | Admitting: Obstetrics and Gynecology

## 2016-11-13 ENCOUNTER — Other Ambulatory Visit: Payer: Self-pay | Admitting: *Deleted

## 2016-11-13 MED ORDER — PROGESTERONE MICRONIZED 200 MG PO CAPS: 200.0000 mg | ORAL_CAPSULE | Freq: Every day | ORAL | 2 refills | Status: DC

## 2016-11-13 MED ORDER — ESTRADIOL 0.5 MG PO TABS: 0.5000 mg | ORAL_TABLET | Freq: Every day | ORAL | 2 refills | Status: DC

## 2017-06-03 ENCOUNTER — Other Ambulatory Visit: Payer: Self-pay | Admitting: Obstetrics and Gynecology

## 2017-07-14 ENCOUNTER — Ambulatory Visit (INDEPENDENT_AMBULATORY_CARE_PROVIDER_SITE_OTHER): Payer: 59

## 2017-07-14 ENCOUNTER — Ambulatory Visit (INDEPENDENT_AMBULATORY_CARE_PROVIDER_SITE_OTHER): Payer: 59 | Admitting: Podiatry

## 2017-07-14 DIAGNOSIS — M205X1 Other deformities of toe(s) (acquired), right foot: Secondary | ICD-10-CM | POA: Diagnosis not present

## 2017-07-14 NOTE — Patient Instructions (Signed)
Pre-Operative Instructions  Congratulations, you have decided to take an important step towards improving your quality of life.  You can be assured that the doctors and staff at [facility] will be with you every step of the way.  Here are some important things you should know:  1. Plan to be at the surgery center/hospital at least 1 (one) hour prior to your scheduled time, unless otherwise directed by the surgical center/hospital staff.  You must have a responsible adult accompany you, remain during the surgery and drive you home.  Make sure you have directions to the surgical center/hospital to ensure you arrive on time. 2. If you are having surgery at [facility] or [facility] hospitals, you will need a copy of your medical history and physical form from your family physician within one month prior to the date of surgery. We will give you a form for your primary physician to complete.  3. We make every effort to accommodate the date you request for surgery.  However, there are times where surgery dates or times have to be moved.  We will contact you as soon as possible if a change in schedule is required.   4. No aspirin/ibuprofen for one week before surgery.  If you are on aspirin, any non-steroidal anti-inflammatory medications (Mobic, Aleve, Ibuprofen) should not be taken seven (7) days prior to your surgery.  You make take Tylenol for pain prior to surgery.  5. Medications - If you are taking daily heart and blood pressure medications, seizure, reflux, allergy, asthma, anxiety, pain or diabetes medications, make sure you notify the surgery center/hospital before the day of surgery so they can tell you which medications you should take or avoid the day of surgery. 6. No food or drink after midnight the night before surgery unless directed otherwise by surgical center/hospital staff. 7. No alcoholic beverages 24-hours prior to surgery.  No smoking 24-hours prior or 24-hours after  surgery. 8. Wear loose pants or shorts. They should be loose enough to fit over bandages, boots, and casts. 9. Don't wear slip-on shoes. Sneakers are preferred. 10. Bring your boot with you to the surgery center/hospital.  Also bring crutches or a walker if your physician has prescribed it for you.  If you do not have this equipment, it will be provided for you after surgery. 11. If you have not been contacted by the surgery center/hospital by the day before your surgery, call to confirm the date and time of your surgery. 12. Leave-time from work may vary depending on the type of surgery you have.  Appropriate arrangements should be made prior to surgery with your employer. 13. Prescriptions will be provided immediately following surgery by your doctor.  Fill these as soon as possible after surgery and take the medication as directed. Pain medications will not be refilled on weekends and must be approved by the doctor. 14. Remove nail polish on the operative foot and avoid getting pedicures prior to surgery. 15. Wash the night before surgery.  The night before surgery wash the foot and leg well with water and the antibacterial soap provided. Be sure to pay special attention to beneath the toenails and in between the toes.  Wash for at least three (3) minutes. Rinse thoroughly with water and dry well with a towel.  Perform this wash unless told not to do so by your physician.  Enclosed: 1 Ice pack (please put in freezer the night before surgery)   1 Hibiclens skin cleaner     Pre-op instructions  If you have any questions regarding the instructions, please do not hesitate to call our office.  [place]: [address], [place], [place] [address] -- [phone]  [place]: [address] [place], [place] [address] -- [phone]  [place]: [address]  [place], [place] [address] -- [phone]  [place]: [address], [address], [place], [place] [address] -- [phone]  Website:  [email or web address] 

## 2017-07-14 NOTE — Progress Notes (Signed)
   Subjective:    Patient ID: Alexandra Mendez, female    DOB: Feb 19, 1961, 56 y.o.   MRN: [id number]  HPI: This patient was referred by [facility]. She is experiencing pain at the level first metatarsophalangeal joint 1 year states this seems to be getting worse. She states that the pain is sometimes constant and stabbing throbbing pain particularly when she's walking and walking seems to make it worse. She states that she's had a cortisone injection in the past which helped for just about a week or so and then it comes painful again. She denies trauma to the foot.    Review of Systems  Musculoskeletal: Positive for arthralgias and back pain.  All other systems reviewed and are negative.      Objective:   Physical Exam: Vital signs are stable she is alert and oriented 3 no apparent distress. Pulses are strongly palpable. Neurologic sensorium is intact. Deep tendon reflexes are intact and equal bilateral. Muscle strength +5 over 5 dorsiflexion plantar flexors and inverters and everters all intrinsic musculature is intact. Orthopedic evaluation and states all joints distal to the ankle for range of motion without crepitation. Wille Celeste is evaluation of a straight supple well-hydrated cutis. She does have pain on palpation of the first metatarsophalangeal joint dorsal and medial she also has pain on end range of motion. Radiographs taken today in the office demonstrated joint space narrowing and elongated first metatarsal and early dorsal spurring consistent with osteoarthritic changes.      Assessment & Plan:   Hallux limitus first metatarsophalangeal joint.  Plan: We discussed etiology pathology conservative versus surgical therapies. Because of this limiting her ability to perform her daily activities surgery is indicated. At this point I feel that an [facility] bunion repair is necessary with screw fixation. We discussed this in great detail today she understands this  and is amenable to it. We did discuss the possible postop complications which may include but are not limited to postop pain bleeding swelling infection recurrence even further surgery problems with anesthesia loss of digit loss of limb loss of life. We also discussed anesthesia and the surgery center today. We dispensed a Cam Walker for her. I will follow-up with her at the time of surgery.

## 2017-08-20 ENCOUNTER — Telehealth: Payer: Self-pay | Admitting: *Deleted

## 2017-08-20 NOTE — Telephone Encounter (Signed)
"  I was told by my doctor that I needed surgery.  I would like to get an estimate about how much it will cost me."  I'll get Chip BoerVicki in the insurance department to give you a call with the estimate.   Eliberto Ivory([place] Bunionectomy right foot-- 1 hour 15 minutes)

## 2017-10-01 ENCOUNTER — Telehealth: Payer: Self-pay | Admitting: *Deleted

## 2017-10-01 NOTE — Telephone Encounter (Signed)
"  I was calling to schedule surgery with Dr. Al CorpusHyatt.

## 2017-10-06 NOTE — Telephone Encounter (Signed)
I'm returning your call.  How can I help you?  "I was calling to schedule my surgery but I'm going to have to hold off because my mother fell and broke her hip and arm.  So, I have to take care of her.  I don't know how long it will take for her to heal so I will call back to schedule it once she's doing better."  Okay, I'll let Dr. Al CorpusHyatt know.

## 2018-05-31 ENCOUNTER — Other Ambulatory Visit: Payer: Self-pay | Admitting: Physician Assistant

## 2018-05-31 DIAGNOSIS — Z1231 Encounter for screening mammogram for malignant neoplasm of breast: Secondary | ICD-10-CM

## 2018-07-20 ENCOUNTER — Ambulatory Visit
Admission: RE | Admit: 2018-07-20 | Discharge: 2018-07-20 | Disposition: A | Discharge status: Home / Self Care | Payer: 59 | Source: Ambulatory Visit | Attending: Physician Assistant | Admitting: Physician Assistant

## 2018-07-20 DIAGNOSIS — Z1231 Encounter for screening mammogram for malignant neoplasm of breast: Secondary | ICD-10-CM | POA: Diagnosis present

## 2018-10-03 ENCOUNTER — Encounter: Payer: Self-pay | Admitting: Podiatry

## 2018-10-03 ENCOUNTER — Ambulatory Visit (INDEPENDENT_AMBULATORY_CARE_PROVIDER_SITE_OTHER): Payer: 59

## 2018-10-03 ENCOUNTER — Ambulatory Visit: Payer: 59 | Admitting: Podiatry

## 2018-10-03 ENCOUNTER — Encounter

## 2018-10-03 DIAGNOSIS — M205X1 Other deformities of toe(s) (acquired), right foot: Secondary | ICD-10-CM

## 2018-10-03 NOTE — Patient Instructions (Signed)
Pre-Operative Instructions  Congratulations, you have decided to take an important step towards improving your quality of life.  You can be assured that the doctors and staff at [facility] will be with you every step of the way.  Here are some important things you should know:  1. Plan to be at the surgery center/hospital at least 1 (one) hour prior to your scheduled time, unless otherwise directed by the surgical center/hospital staff.  You must have a responsible adult accompany you, remain during the surgery and drive you home.  Make sure you have directions to the surgical center/hospital to ensure you arrive on time. 2. If you are having surgery at [facility] or [facility] hospitals, you will need a copy of your medical history and physical form from your family physician within one month prior to the date of surgery. We will give you a form for your primary physician to complete.  3. We make every effort to accommodate the date you request for surgery.  However, there are times where surgery dates or times have to be moved.  We will contact you as soon as possible if a change in schedule is required.   4. No aspirin/ibuprofen for one week before surgery.  If you are on aspirin, any non-steroidal anti-inflammatory medications (Mobic, Aleve, Ibuprofen) should not be taken seven (7) days prior to your surgery.  You make take Tylenol for pain prior to surgery.  5. Medications - If you are taking daily heart and blood pressure medications, seizure, reflux, allergy, asthma, anxiety, pain or diabetes medications, make sure you notify the surgery center/hospital before the day of surgery so they can tell you which medications you should take or avoid the day of surgery. 6. No food or drink after midnight the night before surgery unless directed otherwise by surgical center/hospital staff. 7. No alcoholic beverages 24-hours prior to surgery.  No smoking 24-hours prior or 24-hours after  surgery. 8. Wear loose pants or shorts. They should be loose enough to fit over bandages, boots, and casts. 9. Don't wear slip-on shoes. Sneakers are preferred. 10. Bring your boot with you to the surgery center/hospital.  Also bring crutches or a walker if your physician has prescribed it for you.  If you do not have this equipment, it will be provided for you after surgery. 11. If you have not been contacted by the surgery center/hospital by the day before your surgery, call to confirm the date and time of your surgery. 12. Leave-time from work may vary depending on the type of surgery you have.  Appropriate arrangements should be made prior to surgery with your employer. 13. Prescriptions will be provided immediately following surgery by your doctor.  Fill these as soon as possible after surgery and take the medication as directed. Pain medications will not be refilled on weekends and must be approved by the doctor. 14. Remove nail polish on the operative foot and avoid getting pedicures prior to surgery. 15. Wash the night before surgery.  The night before surgery wash the foot and leg well with water and the antibacterial soap provided. Be sure to pay special attention to beneath the toenails and in between the toes.  Wash for at least three (3) minutes. Rinse thoroughly with water and dry well with a towel.  Perform this wash unless told not to do so by your physician.  Enclosed: 1 Ice pack (please put in freezer the night before surgery)   1 Hibiclens skin cleaner     Pre-op instructions  If you have any questions regarding the instructions, please do not hesitate to call our office.  [place]: [address], [place], [place] [address] -- [phone]  [place]: [address] [place], [place] [address] -- [phone]  [place]: [address]  [place], [place] [address] -- [phone]  [place]: [address], [place], [place] [address] -- [phone]  Website:  [email or web address] 

## 2018-10-03 NOTE — Progress Notes (Signed)
She presents today following getting around for surgical consultation regarding her right foot.  States it is gotten to the point now where it just hurts so bad all the time I can hardly stand it.  I was unable to have surgery last year because my mother's death.  Objective: Vital signs are stable alert and oriented x3.  There is no erythema edema cellulitis drainage or odor there she does have some mild swelling locally around the first metatarsophalangeal joint which is exquisitely painful on range of motion.  Mild hallux valgus deformity is noted.  Radiographic evaluation does confirm bone to bone contact of the first metatarsophalangeal joint with hallux valgus and mild hallux interphalangeal.  Mild dorsal spurring is noted.  Assessment: Hallux limitus with metatarsal phalangeal joint of the right foot.  Plan: Discussed etiology pathology and surgical therapies at this point in time I feel we should go ahead and consented her rather than an [facility] week I feel that we need to do a Keller arthroplasty with a single silicone implant since she has progressed considerably over the past few years.  Consented her today for a Keller arthroplasty with a single silicone implant first metatarsal phalangeal joint of the right foot.  We did discuss the possible postop complications which may include but not limited to postop pain bleeding swelling infection recurrence need further surgery overcorrection under correction loss of digit loss of limb loss of life.  I will follow-up with her in the near future for surgical intervention.

## 2018-10-05 ENCOUNTER — Telehealth: Payer: Self-pay | Admitting: Podiatry

## 2018-10-05 NOTE — Telephone Encounter (Signed)
I'm calling to get my surgery scheduled with Dr. Al CorpusHyatt. I can be reached at [phone]. Thank you.

## 2018-10-07 NOTE — Telephone Encounter (Signed)
Called pt and got her scheduled for surgery on Friday, 11 November 2018. I told her she could go ahead and register online with [facility] or if she was unable to, someone would call and get the information over the phone with her. I told her I could not give her a surgery time and they would be calling her one to two days prior to give her a time. I told her it depended on their schedules and if they have any changes, or patients that are diabetic or kids that need to go back first.

## 2018-12-28 ENCOUNTER — Other Ambulatory Visit: Payer: Self-pay | Admitting: Podiatry

## 2018-12-28 MED ORDER — OXYCODONE-ACETAMINOPHEN 10-325 MG PO TABS: 1.0000 | ORAL_TABLET | Freq: Four times a day (QID) | ORAL | 0 refills | Status: AC | PRN

## 2018-12-28 MED ORDER — CEPHALEXIN 500 MG PO CAPS: 500.0000 mg | ORAL_CAPSULE | Freq: Three times a day (TID) | ORAL | 0 refills | Status: DC

## 2018-12-28 MED ORDER — ONDANSETRON HCL 4 MG PO TABS: 4.0000 mg | ORAL_TABLET | Freq: Three times a day (TID) | ORAL | 0 refills | Status: DC | PRN

## 2018-12-30 ENCOUNTER — Encounter: Payer: Self-pay | Admitting: Podiatry

## 2018-12-30 DIAGNOSIS — M2021 Hallux rigidus, right foot: Secondary | ICD-10-CM

## 2019-01-04 ENCOUNTER — Ambulatory Visit (INDEPENDENT_AMBULATORY_CARE_PROVIDER_SITE_OTHER): Payer: 59

## 2019-01-04 ENCOUNTER — Other Ambulatory Visit: Payer: Self-pay

## 2019-01-04 ENCOUNTER — Ambulatory Visit (INDEPENDENT_AMBULATORY_CARE_PROVIDER_SITE_OTHER): Payer: 59 | Admitting: Podiatry

## 2019-01-04 DIAGNOSIS — Z9889 Other specified postprocedural states: Secondary | ICD-10-CM

## 2019-01-04 DIAGNOSIS — M205X1 Other deformities of toe(s) (acquired), right foot: Secondary | ICD-10-CM

## 2019-01-04 NOTE — Progress Notes (Signed)
She presents today for first postop visit date of surgery was 01/01/2019 she is status post [facility] bunionectomy with implant right foot.  She denies fever chills nausea vomiting muscle aches and pains other than the nausea and vomiting that she is developed with the antibiotic.  She says her foot is really not bothering her.  She continues ibuprofen and Tylenol.  Objective: Vital signs are stable alert and oriented x3.  There is no erythema some mild edema no cellulitis drainage or odor sutures are intact margins remain well coapted.  She has good range of motion of the first metatarsophalangeal joint.  Radiographs taken today demonstrate a Keller arthroplasty with single silicone implant without complications.  Assessment: Well-healing surgical foot.  Plan: Encourage range of motion exercises redressed today dressed a compressive dressing keep this dry clean and elevated follow-up with me in 1 week.

## 2019-01-11 ENCOUNTER — Ambulatory Visit (INDEPENDENT_AMBULATORY_CARE_PROVIDER_SITE_OTHER): Payer: 59 | Admitting: Podiatry

## 2019-01-11 ENCOUNTER — Other Ambulatory Visit: Payer: Self-pay

## 2019-01-11 DIAGNOSIS — M205X1 Other deformities of toe(s) (acquired), right foot: Secondary | ICD-10-CM

## 2019-01-11 DIAGNOSIS — Z9889 Other specified postprocedural states: Secondary | ICD-10-CM

## 2019-01-11 NOTE — Progress Notes (Signed)
She presents today for Alexandra Mendez second postop visit date of surgery 12/30/2018 status post Alexandra Mendez bunion repair right.  States that I am doing good she denies fever chills nausea vomiting muscle aches pain states that she is been walking on it barefoot.  Objective: Vital signs are stable alert and oriented x3.  Sutures removed today minimal edema no erythema cellulitis drainage odor great range of motion.  Assessment: Well-healing surgical foot.  Plan: Continue to increase range of motion and get Alexandra Mendez out of Alexandra Mendez cam walker today placed in a compression anklet and a Darco shoe we will follow-up with Alexandra Mendez in 2 weeks.

## 2019-01-25 ENCOUNTER — Other Ambulatory Visit: Payer: 59

## 2019-01-27 ENCOUNTER — Other Ambulatory Visit: Payer: Self-pay

## 2019-01-27 ENCOUNTER — Ambulatory Visit (INDEPENDENT_AMBULATORY_CARE_PROVIDER_SITE_OTHER): Payer: 59 | Admitting: Podiatry

## 2019-01-27 ENCOUNTER — Encounter: Payer: Self-pay | Admitting: Podiatry

## 2019-01-27 ENCOUNTER — Ambulatory Visit (INDEPENDENT_AMBULATORY_CARE_PROVIDER_SITE_OTHER): Payer: 59

## 2019-01-27 VITALS — Temp 98.7°F

## 2019-01-27 DIAGNOSIS — Z9889 Other specified postprocedural states: Secondary | ICD-10-CM

## 2019-01-27 DIAGNOSIS — M205X1 Other deformities of toe(s) (acquired), right foot: Secondary | ICD-10-CM

## 2019-01-27 NOTE — Progress Notes (Signed)
   Subjective:  Patient presents today status post Keller bunion implant right foot. DOS: 12/30/2018.  Patient denies pain at the moment.  She said that she does have some very minimal achiness intermittently throughout the last few days.  Otherwise no complaints she has been wearing regular shoe gear without any problems.  Past Medical History:  Diagnosis Date  . Anxiety   . Depression   . GERD (gastroesophageal reflux disease)   . Migraine       Objective/Physical Exam Neurovascular status intact.  Skin incisions appear to be well coapted.  Negative for any significant edema.  Range of motion is close to normal limits within the MTPJ.  Negative for any pain on palpation or range of motion.  Radiographic Exam:  Implant hardware hardware and osteotomies sites appear to be stable with routine healing.  Assessment: 1. s/p Keller bunion implant right foot. DOS: 12/30/2018   Plan of Care:  1. Patient was evaluated. X-rays reviewed 2.  Patient has been wearing good supportive shoes.  Continue wearing good supportive shoe gear. 3.  Patient may resume full activity no restrictions 4.  Return to clinic as needed   Felecia Shelling, DPM [facility]  Dr. Felecia Shelling, DPM    [address]                                        [place], [place] [address]                Office [phone]  Fax [phone]

## 2019-02-07 ENCOUNTER — Other Ambulatory Visit: Payer: 59

## 2019-02-08 ENCOUNTER — Other Ambulatory Visit: Payer: 59

## 2019-09-19 ENCOUNTER — Other Ambulatory Visit: Payer: Self-pay | Admitting: Physician Assistant

## 2019-09-19 ENCOUNTER — Other Ambulatory Visit: Payer: Self-pay

## 2019-09-19 DIAGNOSIS — Z1231 Encounter for screening mammogram for malignant neoplasm of breast: Secondary | ICD-10-CM

## 2019-09-19 DIAGNOSIS — Z20822 Contact with and (suspected) exposure to covid-19: Secondary | ICD-10-CM

## 2019-09-21 LAB — NOVEL CORONAVIRUS, NAA: SARS-CoV-2, NAA: NOT DETECTED

## 2019-09-27 ENCOUNTER — Ambulatory Visit
Admission: RE | Admit: 2019-09-27 | Discharge: 2019-09-27 | Disposition: A | Discharge status: Home / Self Care | Payer: BC Managed Care – PPO | Source: Ambulatory Visit | Attending: Physician Assistant | Admitting: Physician Assistant

## 2019-09-27 DIAGNOSIS — Z1231 Encounter for screening mammogram for malignant neoplasm of breast: Secondary | ICD-10-CM

## 2020-01-16 ENCOUNTER — Encounter: Payer: Self-pay | Admitting: Emergency Medicine

## 2020-01-16 ENCOUNTER — Ambulatory Visit
Admission: EM | Admit: 2020-01-16 | Discharge: 2020-01-16 | Disposition: A | Discharge status: Home / Self Care | Payer: BC Managed Care – PPO | Attending: Urgent Care | Admitting: Urgent Care

## 2020-01-16 ENCOUNTER — Other Ambulatory Visit: Payer: Self-pay

## 2020-01-16 ENCOUNTER — Ambulatory Visit (INDEPENDENT_AMBULATORY_CARE_PROVIDER_SITE_OTHER): Payer: BC Managed Care – PPO

## 2020-01-16 DIAGNOSIS — S2242XA Multiple fractures of ribs, left side, initial encounter for closed fracture: Secondary | ICD-10-CM | POA: Diagnosis not present

## 2020-01-16 DIAGNOSIS — R0789 Other chest pain: Secondary | ICD-10-CM

## 2020-01-16 DIAGNOSIS — S301XXA Contusion of abdominal wall, initial encounter: Secondary | ICD-10-CM

## 2020-01-16 DIAGNOSIS — S20211A Contusion of right front wall of thorax, initial encounter: Secondary | ICD-10-CM

## 2020-01-16 MED ORDER — DOCUSATE SODIUM 100 MG PO CAPS: 100.0000 mg | ORAL_CAPSULE | Freq: Every day | ORAL | 0 refills | Status: AC

## 2020-01-16 MED ORDER — OXYCODONE-ACETAMINOPHEN 5-325 MG PO TABS: 1.0000 | ORAL_TABLET | Freq: Three times a day (TID) | ORAL | 0 refills | Status: AC | PRN
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
## 2020-01-16 NOTE — Discharge Instructions (Addendum)
It was very nice seeing you today in clinic. Thank you for entrusting me with your care.   Make efforts to remain physically active. Use incentive spirometer every 2-3 hours to help promote lung expansion and prevent pneumonia. Use pain medication; be careful it will make you sleepy. I would add a stool softener while on the pain medication. Splint abdomen when coughing or when changing positions. May find that applying heat and/or ice will help reduce pain as well.   Make arrangements to follow up with your regular doctor this week for re-assessment of your breathing and pain control. If your symptoms/condition worsens, please seek follow up care either here or in the ER. Please remember, our [facility] providers are "right here with you" when you need [place].   Again, it was my pleasure to take care of you today. Thank you for choosing our clinic. I hope that you start to feel better quickly.   Quentin Mulling, MSN, APRN, FNP-C, CEN Advanced Practice Provider [facility]
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
## 2020-06-06 ENCOUNTER — Ambulatory Visit: Payer: BC Managed Care – PPO | Admitting: Podiatry

## 2020-06-06 ENCOUNTER — Ambulatory Visit (INDEPENDENT_AMBULATORY_CARE_PROVIDER_SITE_OTHER): Payer: BC Managed Care – PPO

## 2020-06-06 ENCOUNTER — Other Ambulatory Visit: Payer: Self-pay

## 2020-06-06 DIAGNOSIS — M25474 Effusion, right foot: Secondary | ICD-10-CM | POA: Diagnosis not present

## 2020-06-06 DIAGNOSIS — M205X1 Other deformities of toe(s) (acquired), right foot: Secondary | ICD-10-CM

## 2020-06-09 NOTE — Progress Notes (Signed)
°  Subjective:  Patient ID: Alexandra Mendez, female    DOB: 18-Dec-1960,  MRN: [id number]  Chief Complaint  Patient presents with   Toe Pain    right great toe injury    59 y.o. female presents with the above complaint. History confirmed with patient.  She has had pain and swelling in the right great toe joint since Sunday.  This is same joint that she had replaced with Dr. Al Corpus in March 2020.  Does not recall a specific injury.  Otherwise feels well  Objective:  Physical Exam: warm, good capillary refill, no trophic changes or ulcerative lesions, normal DP and PT pulses and normal sensory exam.   Right Foot: Mild edema and erythema over the right first metatarsophalangeal joint, well-healed surgical incision.  No sinus or open draining wounds.  No cellulitis.   Radiographs: X-ray of the right foot: Implants in similar position to previous radiographs from April 2020.  There is new bony formation over the dorsal proximal phalanx and lateral first metatarsal, unclear if this is new bony ingrowth or small fracture. Assessment:   1. Hallux limitus of right foot   2. Swelling of first metatarsophalangeal (MTP) joint of right foot      Plan:  Patient was evaluated and treated and all questions answered.   -Discussed possible etiologies with her including a contusion or jamming of the joint, the possibility that the bony growth noted could be small fractures, and less likely that she is having a reaction or synovitis from the silicone or a delayed infection.  I would like to order some lab work to evaluate to make sure that she is not having infection.  Hope this will be self-limiting for her.  A low top CAM boot was dispensed.  WBAT in this  Return in about 4 weeks (around 07/04/2020).

## 2020-06-10 ENCOUNTER — Other Ambulatory Visit: Payer: Self-pay

## 2020-06-10 DIAGNOSIS — M25474 Effusion, right foot: Secondary | ICD-10-CM

## 2020-06-27 ENCOUNTER — Other Ambulatory Visit: Payer: Self-pay

## 2020-06-27 ENCOUNTER — Ambulatory Visit: Payer: BC Managed Care – PPO | Admitting: Podiatry

## 2020-06-27 DIAGNOSIS — Z9889 Other specified postprocedural states: Secondary | ICD-10-CM | POA: Diagnosis not present

## 2020-06-27 DIAGNOSIS — M205X1 Other deformities of toe(s) (acquired), right foot: Secondary | ICD-10-CM | POA: Diagnosis not present

## 2020-06-27 DIAGNOSIS — M25474 Effusion, right foot: Secondary | ICD-10-CM

## 2020-07-02 NOTE — Progress Notes (Signed)
  Subjective:  Patient ID: Alexandra Mendez, female    DOB: 1961/04/12,  MRN: [id number]  Chief Complaint  Patient presents with  . Toe Pain    3 wk right toe follow up    59 y.o. female returns with the above complaint. History confirmed with patient.  Doing much better, she has no pain now.  She is back in regular shoe.  There is an issue getting lab work and she did not complete it but did not feel it she needed it.  Objective:  Physical Exam: warm, good capillary refill, no trophic changes or ulcerative lesions, normal DP and PT pulses and normal sensory exam.   Right Foot: No edema or erythema today, good range of motion without pain.   Assessment:   1. S/P foot surgery, right   2. Hallux limitus of right foot   3. Swelling of first metatarsophalangeal (MTP) joint of right foot      Plan:  Patient was evaluated and treated and all questions answered.   -Pain has resolved.  Resume full activity and regular shoe gear.  Return if symptoms worsen or fail to improve.

## 2020-10-07 ENCOUNTER — Other Ambulatory Visit: Payer: Self-pay | Admitting: Physician Assistant

## 2020-10-07 DIAGNOSIS — Z1231 Encounter for screening mammogram for malignant neoplasm of breast: Secondary | ICD-10-CM

## 2020-11-15 ENCOUNTER — Other Ambulatory Visit: Payer: Self-pay

## 2020-11-15 ENCOUNTER — Ambulatory Visit
Admission: RE | Admit: 2020-11-15 | Discharge: 2020-11-15 | Disposition: A | Discharge status: Home / Self Care | Payer: BC Managed Care – PPO | Source: Ambulatory Visit | Attending: Physician Assistant | Admitting: Physician Assistant

## 2020-11-15 DIAGNOSIS — Z1231 Encounter for screening mammogram for malignant neoplasm of breast: Secondary | ICD-10-CM | POA: Insufficient documentation

## 2021-10-03 ENCOUNTER — Other Ambulatory Visit: Payer: Self-pay | Admitting: Physician Assistant

## 2021-10-03 DIAGNOSIS — Z1231 Encounter for screening mammogram for malignant neoplasm of breast: Secondary | ICD-10-CM

## 2022-01-20 ENCOUNTER — Ambulatory Visit
Admission: RE | Admit: 2022-01-20 | Discharge: 2022-01-20 | Disposition: A | Discharge status: Home / Self Care | Payer: BC Managed Care – PPO | Source: Ambulatory Visit | Attending: Physician Assistant | Admitting: Physician Assistant

## 2022-01-20 ENCOUNTER — Other Ambulatory Visit: Payer: Self-pay

## 2022-01-20 DIAGNOSIS — Z1231 Encounter for screening mammogram for malignant neoplasm of breast: Secondary | ICD-10-CM | POA: Diagnosis not present

## 2023-01-12 ENCOUNTER — Other Ambulatory Visit: Payer: Self-pay | Admitting: Physician Assistant

## 2023-01-12 DIAGNOSIS — Z1231 Encounter for screening mammogram for malignant neoplasm of breast: Secondary | ICD-10-CM

## 2023-02-01 ENCOUNTER — Ambulatory Visit
Admission: RE | Admit: 2023-02-01 | Discharge: 2023-02-01 | Disposition: A | Discharge status: Home / Self Care | Payer: 59 | Source: Ambulatory Visit | Attending: Physician Assistant | Admitting: Physician Assistant

## 2023-02-01 DIAGNOSIS — Z1231 Encounter for screening mammogram for malignant neoplasm of breast: Secondary | ICD-10-CM | POA: Insufficient documentation

## 2023-09-25 IMAGING — MG MM DIGITAL SCREENING BILAT W/ TOMO AND CAD
8 series · 8 of 24 positions shown · non-contrast
Comparison: Previous exam(s).

CLINICAL DATA: Screening.

EXAM:
DIGITAL SCREENING BILATERAL MAMMOGRAM WITH TOMOSYNTHESIS AND CAD
TECHNIQUE: Bilateral screening digital craniocaudal and mediolateral oblique
mammograms were obtained. Bilateral screening digital breast
tomosynthesis was performed. The images were evaluated with
computer-aided detection.

[L CC synth-2D]
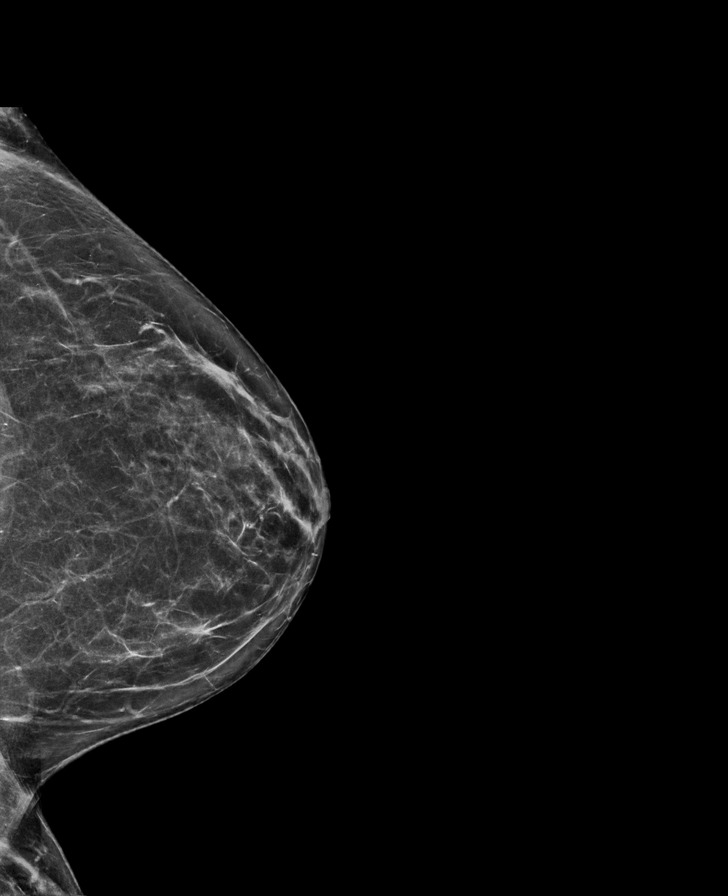

[R CC synth-2D]
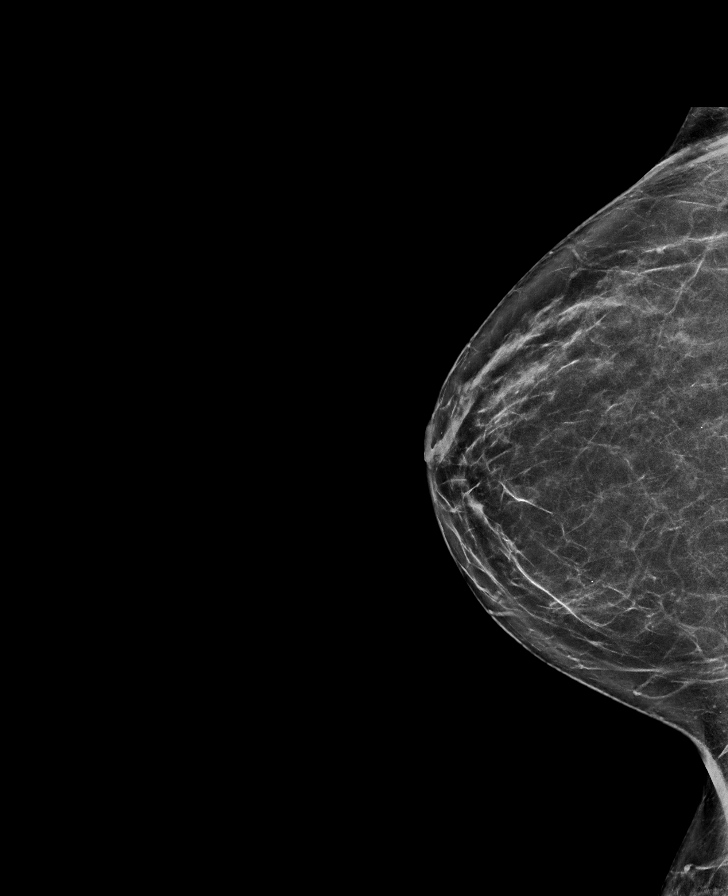

[R MLO synth-2D]
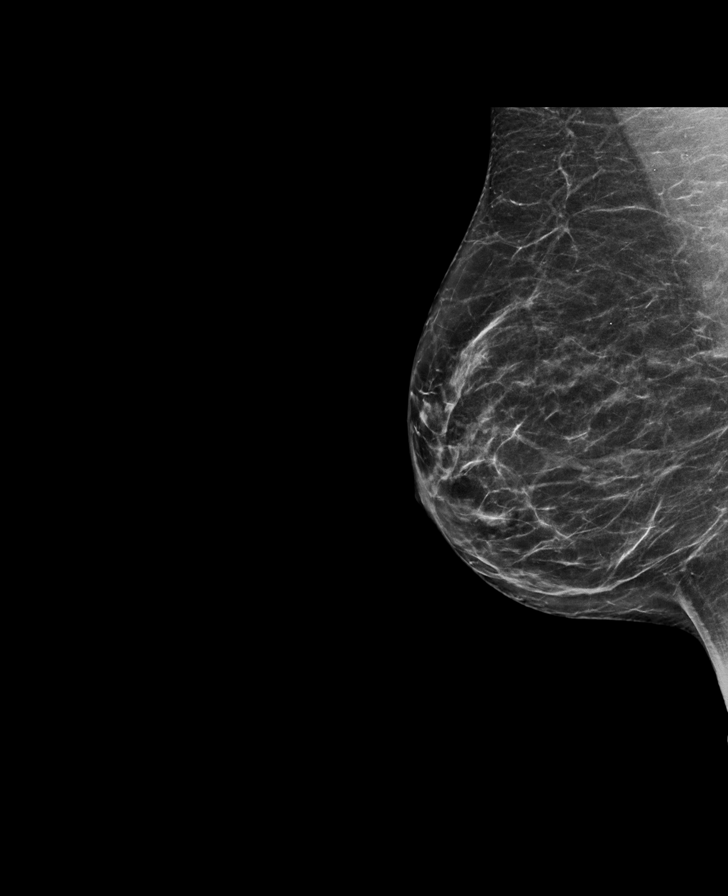

[L MLO synth-2D]
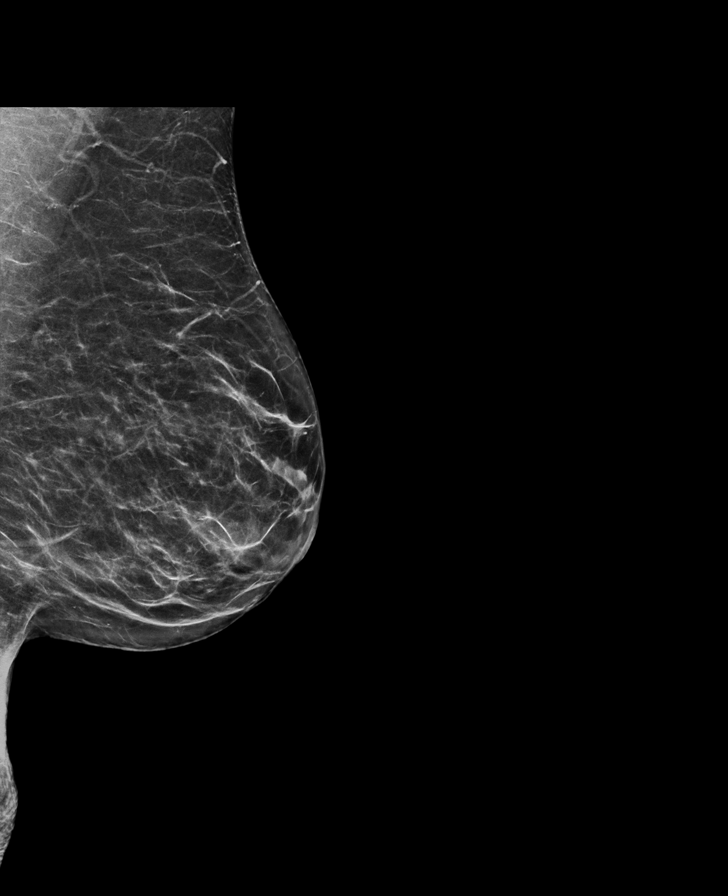

[R MLO tomo · tomo slice 35/70.0]
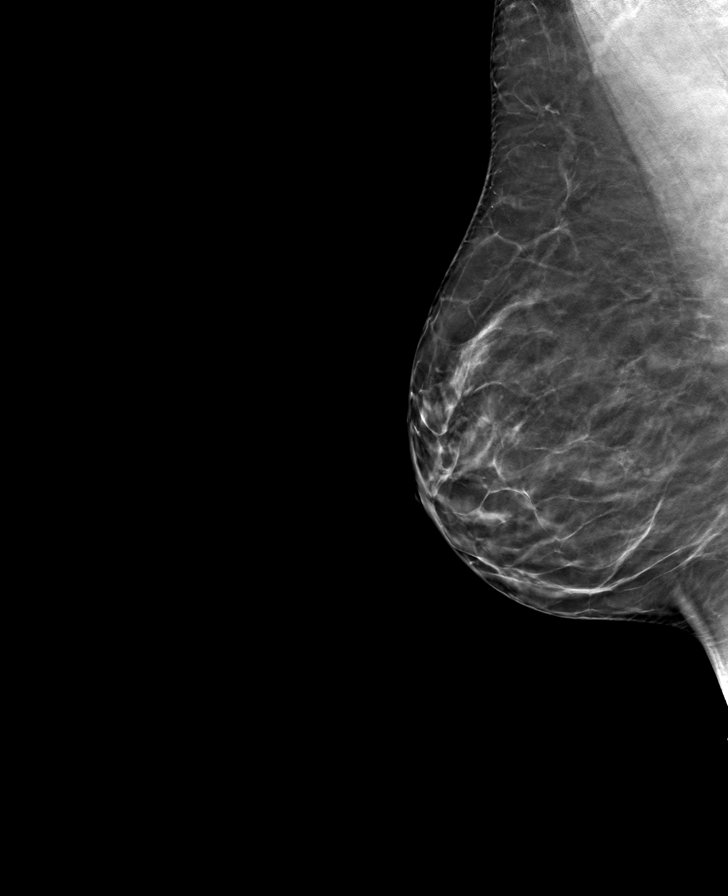

[L CC tomo · tomo slice 35/69.0]
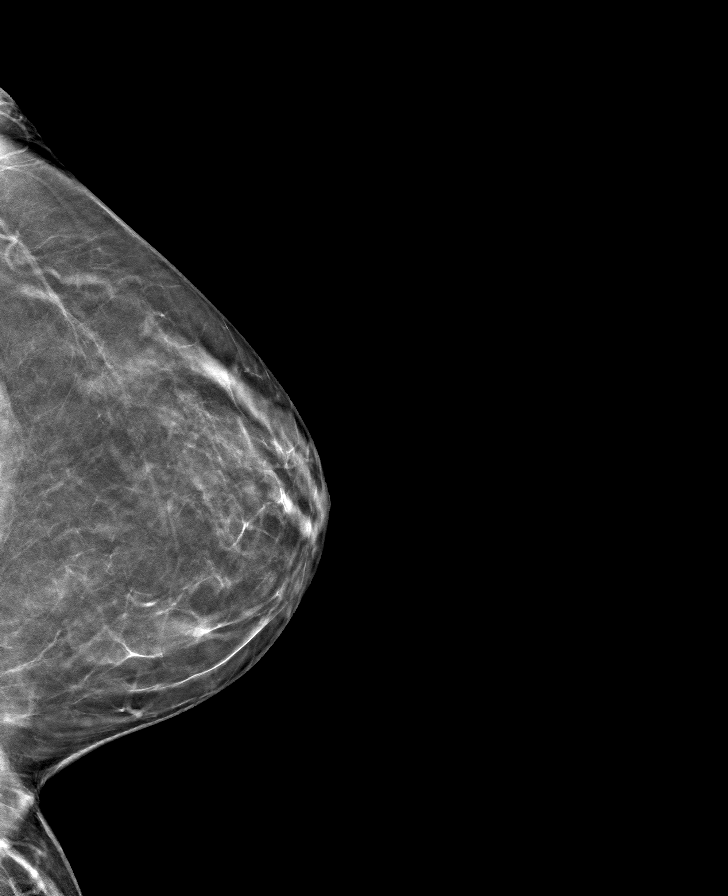

[R CC tomo · tomo slice 35/70.0]
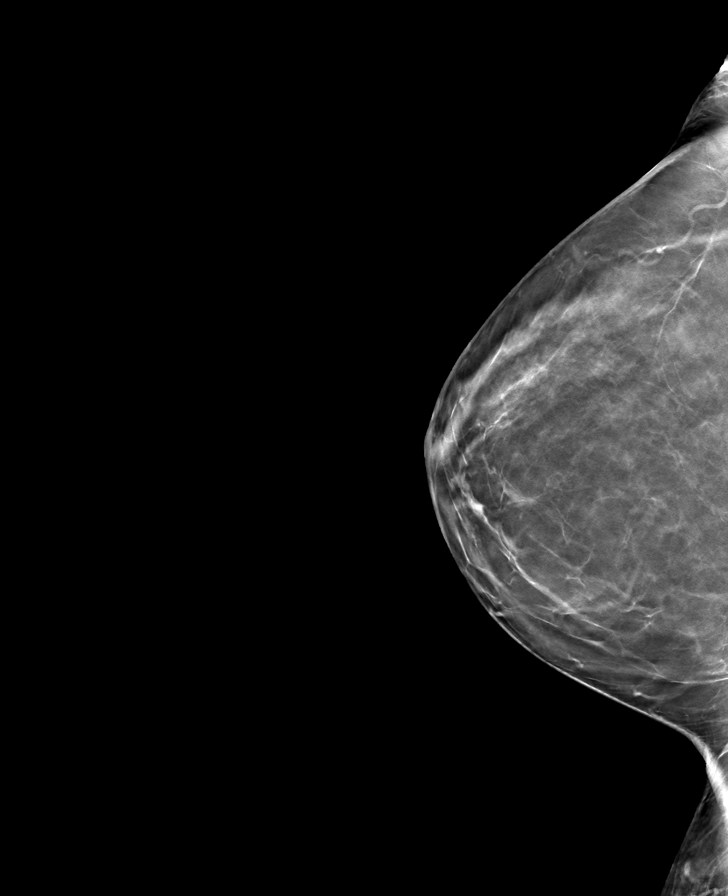

[L MLO tomo · tomo slice 34/67.0]
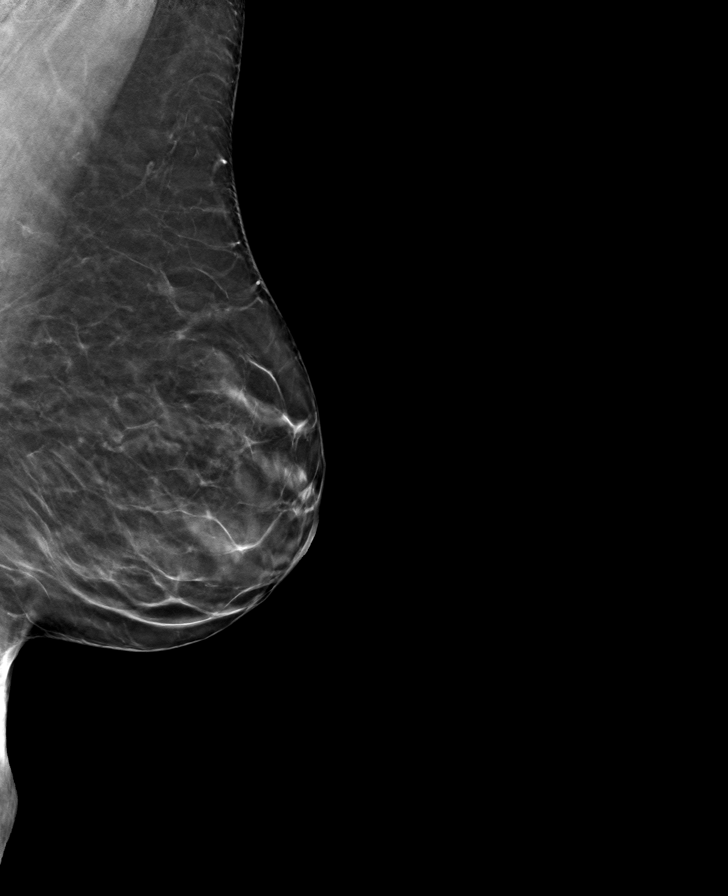

[8 of 24 positions shown; findings below may reference images not displayed]

ACR Breast Density Category b: There are scattered areas of
fibroglandular density.
FINDINGS: There are no findings suspicious for malignancy.
IMPRESSION: No mammographic evidence of malignancy. A result letter of this
screening mammogram will be mailed directly to the patient.

RECOMMENDATION:
Screening mammogram in one year. (Code:51-O-LD2)

BI-RADS CATEGORY  1: Negative.

## 2024-02-16 ENCOUNTER — Other Ambulatory Visit: Payer: Self-pay | Admitting: Physician Assistant

## 2024-02-16 DIAGNOSIS — Z1231 Encounter for screening mammogram for malignant neoplasm of breast: Secondary | ICD-10-CM

## 2024-02-22 ENCOUNTER — Other Ambulatory Visit: Payer: Self-pay | Admitting: Physician Assistant

## 2024-02-22 DIAGNOSIS — Z1231 Encounter for screening mammogram for malignant neoplasm of breast: Secondary | ICD-10-CM

## 2024-02-22 DIAGNOSIS — N6459 Other signs and symptoms in breast: Secondary | ICD-10-CM

## 2024-02-24 ENCOUNTER — Ambulatory Visit
Admission: RE | Admit: 2024-02-24 | Discharge: 2024-02-24 | Disposition: A | Discharge status: Home / Self Care | Source: Ambulatory Visit | Attending: Physician Assistant | Admitting: Physician Assistant

## 2024-02-24 DIAGNOSIS — N6459 Other signs and symptoms in breast: Secondary | ICD-10-CM | POA: Insufficient documentation

## 2024-02-24 DIAGNOSIS — Z1231 Encounter for screening mammogram for malignant neoplasm of breast: Secondary | ICD-10-CM | POA: Insufficient documentation
